# Patient Record
Sex: Male | Born: 1985 | Race: Black or African American | Hispanic: No | Marital: Single
Health system: Southern US, Community
[De-identification: ages and names within clinical notes are randomized; demographics above are authoritative.]

---

## 2009-02-28 ENCOUNTER — Inpatient Hospital Stay: Payer: Self-pay | Admitting: Internal Medicine

## 2009-10-11 ENCOUNTER — Emergency Department: Payer: Self-pay | Admitting: Emergency Medicine

## 2009-10-27 ENCOUNTER — Emergency Department: Payer: Self-pay | Admitting: Emergency Medicine

## 2009-10-29 ENCOUNTER — Emergency Department: Payer: Self-pay | Admitting: Emergency Medicine

## 2012-11-02 ENCOUNTER — Emergency Department: Payer: Self-pay | Admitting: Emergency Medicine

## 2013-02-15 ENCOUNTER — Emergency Department: Payer: Self-pay | Admitting: Emergency Medicine

## 2013-02-15 LAB — CBC
HCT: 43.6 % (ref 40.0–52.0)
HGB: 13.8 g/dL (ref 13.0–18.0)
MCH: 25.2 pg — ABNORMAL LOW (ref 26.0–34.0)
MCHC: 31.5 g/dL — ABNORMAL LOW (ref 32.0–36.0)
MCV: 80 fL (ref 80–100)
PLATELETS: 188 10*3/uL (ref 150–440)
RBC: 5.47 10*6/uL (ref 4.40–5.90)
RDW: 15.8 % — AB (ref 11.5–14.5)
WBC: 7.1 10*3/uL (ref 3.8–10.6)

## 2013-02-15 LAB — COMPREHENSIVE METABOLIC PANEL
ALT: 68 U/L (ref 12–78)
Albumin: 3.3 g/dL — ABNORMAL LOW (ref 3.4–5.0)
Alkaline Phosphatase: 162 U/L — ABNORMAL HIGH
Anion Gap: 2 — ABNORMAL LOW (ref 7–16)
BUN: 15 mg/dL (ref 7–18)
Bilirubin,Total: 0.4 mg/dL (ref 0.2–1.0)
CHLORIDE: 107 mmol/L (ref 98–107)
Calcium, Total: 8.8 mg/dL (ref 8.5–10.1)
Co2: 27 mmol/L (ref 21–32)
Creatinine: 1.06 mg/dL (ref 0.60–1.30)
EGFR (Non-African Amer.): >60
GLUCOSE: 86 mg/dL (ref 65–99)
OSMOLALITY: 272 (ref 275–301)
POTASSIUM: 4.2 mmol/L (ref 3.5–5.1)
SGOT(AST): 62 U/L — ABNORMAL HIGH (ref 15–37)
Sodium: 136 mmol/L (ref 136–145)
Total Protein: 7.5 g/dL (ref 6.4–8.2)

## 2013-02-15 LAB — ACETAMINOPHEN LEVEL

## 2013-02-15 LAB — TSH: THYROID STIMULATING HORM: 0.9 u[IU]/mL

## 2013-02-15 LAB — SALICYLATE LEVEL: Salicylates, Serum: 1.7 mg/dL

## 2013-02-15 LAB — ETHANOL
Ethanol %: <0.003 % (ref 0.000–0.080)
Ethanol: 3 mg/dL

## 2013-02-16 LAB — DRUG SCREEN, URINE
AMPHETAMINES, UR SCREEN: NEGATIVE (ref ?–1000)
BENZODIAZEPINE, UR SCRN: POSITIVE (ref ?–200)
Barbiturates, Ur Screen: NEGATIVE (ref ?–200)
CANNABINOID 50 NG, UR ~~LOC~~: POSITIVE (ref ?–50)
COCAINE METABOLITE, UR ~~LOC~~: POSITIVE (ref ?–300)
MDMA (Ecstasy)Ur Screen: NEGATIVE (ref ?–500)
METHADONE, UR SCREEN: NEGATIVE (ref ?–300)
Opiate, Ur Screen: NEGATIVE (ref ?–300)
Phencyclidine (PCP) Ur S: NEGATIVE (ref ?–25)
TRICYCLIC, UR SCREEN: NEGATIVE (ref ?–1000)

## 2013-05-30 ENCOUNTER — Emergency Department: Payer: Self-pay | Admitting: Emergency Medicine

## 2013-06-02 LAB — BETA STREP CULTURE(ARMC)

## 2013-06-23 ENCOUNTER — Emergency Department: Payer: Self-pay | Admitting: Emergency Medicine

## 2014-04-29 ENCOUNTER — Emergency Department
Admit: 2014-04-29 | Disposition: A | Discharge status: Home / Self Care | Payer: Self-pay | Admitting: Emergency Medicine

## 2014-04-29 LAB — BASIC METABOLIC PANEL
Anion Gap: 7 (ref 7–16)
BUN: 19 mg/dL
CHLORIDE: 102 mmol/L
CREATININE: 1.17 mg/dL
Calcium, Total: 9.4 mg/dL
Co2: 31 mmol/L
EGFR (African American): >60
EGFR (Non-African Amer.): >60
Glucose: 99 mg/dL
Potassium: 3.6 mmol/L
Sodium: 140 mmol/L

## 2014-04-29 LAB — CBC
HCT: 46 % (ref 40.0–52.0)
HGB: 15 g/dL (ref 13.0–18.0)
MCH: 25.6 pg — ABNORMAL LOW (ref 26.0–34.0)
MCHC: 32.6 g/dL (ref 32.0–36.0)
MCV: 78 fL — ABNORMAL LOW (ref 80–100)
Platelet: 199 10*3/uL (ref 150–440)
RBC: 5.87 10*6/uL (ref 4.40–5.90)
RDW: 16.2 % — ABNORMAL HIGH (ref 11.5–14.5)
WBC: 9.7 10*3/uL (ref 3.8–10.6)

## 2014-04-29 LAB — TROPONIN I: Troponin-I: <0.03 ng/mL

## 2014-09-02 ENCOUNTER — Other Ambulatory Visit: Payer: Self-pay | Admitting: Family Medicine

## 2014-09-02 ENCOUNTER — Ambulatory Visit
Admission: RE | Admit: 2014-09-02 | Discharge: 2014-09-02 | Disposition: A | Discharge status: Home / Self Care | Payer: Self-pay | Source: Ambulatory Visit | Attending: Family Medicine | Admitting: Family Medicine

## 2014-09-02 DIAGNOSIS — M25561 Pain in right knee: Secondary | ICD-10-CM

## 2014-09-02 DIAGNOSIS — M25461 Effusion, right knee: Secondary | ICD-10-CM | POA: Insufficient documentation

## 2014-11-11 ENCOUNTER — Emergency Department
Admission: EM | Admit: 2014-11-11 | Discharge: 2014-11-15 | Disposition: E | Payer: Self-pay | Attending: Emergency Medicine | Admitting: Emergency Medicine

## 2014-11-11 DIAGNOSIS — Y9289 Other specified places as the place of occurrence of the external cause: Secondary | ICD-10-CM | POA: Insufficient documentation

## 2014-11-11 DIAGNOSIS — Y9389 Activity, other specified: Secondary | ICD-10-CM | POA: Insufficient documentation

## 2014-11-11 DIAGNOSIS — E669 Obesity, unspecified: Secondary | ICD-10-CM | POA: Insufficient documentation

## 2014-11-11 DIAGNOSIS — T405X1A Poisoning by cocaine, accidental (unintentional), initial encounter: Secondary | ICD-10-CM | POA: Insufficient documentation

## 2014-11-11 DIAGNOSIS — I469 Cardiac arrest, cause unspecified: Secondary | ICD-10-CM | POA: Insufficient documentation

## 2014-11-11 DIAGNOSIS — Y998 Other external cause status: Secondary | ICD-10-CM | POA: Insufficient documentation

## 2014-11-11 MED ORDER — DEXTROSE 50 % IV SOLN
INTRAVENOUS | Status: AC | PRN
  Administered 2014-11-11: 1 via INTRAVENOUS

## 2014-11-11 MED ORDER — SODIUM CHLORIDE 0.9 % IV SOLN
INTRAVENOUS | Status: AC | PRN
  Administered 2014-11-11: 1000 mL via INTRAVENOUS

## 2014-11-11 MED ORDER — EPINEPHRINE HCL 0.1 MG/ML IJ SOSY
PREFILLED_SYRINGE | INTRAMUSCULAR | Status: AC | PRN
  Administered 2014-11-11 (×4): 1 mg via INTRAVENOUS

## 2014-11-11 MED ORDER — SODIUM BICARBONATE 8.4 % IV SOLN
INTRAVENOUS | Status: AC | PRN
  Administered 2014-11-11 (×2): 50 meq via INTRAVENOUS

## 2014-11-11 MED ORDER — NALOXONE HCL 2 MG/2ML IJ SOSY
PREFILLED_SYRINGE | INTRAMUSCULAR | Status: AC | PRN
  Administered 2014-11-11: 4 mg via INTRAVENOUS

## 2014-11-11 MED FILL — Medication: Qty: 1 | Status: AC

## 2014-11-15 NOTE — ED Provider Notes (Signed)
[facility] Emergency Department Provider Note   ____________________________________________  Time seen: Upon EMS arrival I have reviewed the triage vital signs and the triage nursing note.  HISTORY  Chief Complaint No chief complaint on file.  cardiopulmonary arrest  Historian Limited, patient under cardiopulmonary arrest. History obtained from EMS History obtained from family members  HPI Juan Vazquez is a 29 y.o. male who reportedly "swallowed an eightball"at some point overnight. Reportedly according to his family he was stopped by the police and ingested a bag of cocaine. At home he started to act erratic, and profusely sweating and beat the walls according to family member. Upon EMS arrival the patient was witnessed to have what appeared to be a seizure/convulsions. During EMS transport the patient did convulse again and went into full cardiopulmonary arrest. A King airway was placed prehospital, and CPR was initiated. There was no reported return of spontaneous circulation, and no shockable rhythm. 3 rounds of epinephrine were administered prehospital.    No past medical history on file. obesity  There are no active problems to display for this patient.   No past surgical history on file.  No current outpatient prescriptions on file.  Allergies Review of patient's allergies indicates not on file.  No family history on file.  Social History Social History  Substance Use Topics  . Smoking status: Not on file  . Smokeless tobacco: Not on file  . Alcohol Use: Not on file   unknown  Review of Systems Limited/unknown due to patient in cardiopulmonary arrest Constitutional: . Eyes:  ENT:  Cardiovascular:  Respiratory:  Gastrointestinal:  Genitourinary:  Musculoskeletal: Skin:  Neurological:   ____________________________________________   PHYSICAL EXAM:  VITAL SIGNS: Pulseless, active CPR in progress. Bag valve ventilated  through a King airway ED Triage Vitals  Enc Vitals Group     BP --      Pulse --      Resp --      Temp --      Temp src --      SpO2 --      Weight --      Height --      Head Cir --      Peak Flow --      Pain Score --      Pain Loc --      Pain Edu? --      Excl. in [place]? --      Constitutional: Full cardiopulmonary arrest. Eyes: Conjunctivae are normal. Pupils fixed mid-dilated. ENT   Head: Normocephalic and atraumatic.   Nose:   Mouth/Throat: Mucous membranes are moist. King airway in place. Large tongue slightly purplish   Neck: No visible abnormal Cardiovascular/Chest: Good femoral pulses with CPR Respiratory: Equal breath sounds with bag Gastrointestinal: Soft. Obese Genitourinary/rectal: Musculoskeletal: No visible trauma Neurologic: No movement, no attempts at breathing Skin:  Skin is cool.  ____________________________________________   EKG I, Lisa Roca, MD, the attending physician have personally viewed and interpreted all ECGs.  No EKG performed ____________________________________________  LABS (pertinent positives/negatives)  None  ____________________________________________  RADIOLOGY All Xrays were viewed by me. Imaging interpreted by Radiologist.  None __________________________________________  PROCEDURES  Procedure(s) performed: None  Critical Care performed: CRITICAL CARE Performed by: Lisa Roca   Total critical care time: 60 minutes  Critical care time was exclusive of separately billable procedures and treating other patients.  Critical care was necessary to treat or prevent imminent or life-threatening deterioration.  Critical care was time  spent personally by me on the following activities: development of treatment plan with patient and/or surrogate as well as nursing, discussions with consultants, evaluation of patient's response to treatment, examination of patient, obtaining history from patient or  surrogate, ordering and performing treatments and interventions, ordering and review of laboratory studies, ordering and review of radiographic studies, pulse oximetry and re-evaluation of patient's condition.   ____________________________________________   ED COURSE / ASSESSMENT AND PLAN  CONSULTATIONS: Medical Examiner Lanny Hurst), referred and accepted for autopsy  Pertinent labs & imaging results that were available during my care of the patient were reviewed by me and considered in my medical decision making (see chart for details).  Patient arrived as a young male concerning for possible drug overdose, who had a cardiopulmonary arrest after being witnessed to be short of breath, diaphoretic, and then have convulsions. The patient was given Narcan and epinephrine prehospital with no improvement.  On arrival was confirmed to the patient had a disorganized rhythm with no pulse. CPR was continued according to typical protocol. Patient's King airway was assessed and felt to be adequate for oxygenation. Although the patient's initial sats were in the 39s, they did come up to 100% during the process of CPR with good bagging.  I felt no indication for removing the adequate airway.  Given there is no clear known toxic ingestion, 2 A of bicarbonate were tried as well as an amp of glucose in addition to continuing epinephrine every 3 minutes.  At every rhythm check there was pulseless electrical activity. After multiple rounds of high quality chest compressions, medications, airway management, and 2 L fluid bolus, bedside ultrasound was used to evaluate cardiac activity and there was found to be no cardiac wall motion.  At this point CPR was discontinued and time death was pronounced at 12:24 PM.  With nursing chaperone at the bedside, I did inform the patient's family.  The patient's mom did show up later and was informed/met by nurse.   The family did provide additional history is that the  patient took in overdose on cocaine. This was not reported suicide, however he was apparently trying to hide evidence from police when he ingested a large bag of cocaine.  This is consistent with likely coronary event causing death.  Case was referred to the medical examiner and accepted for autopsy.    ___________________________________________   FINAL CLINICAL IMPRESSION(S) / ED DIAGNOSES   Final diagnoses:  Cardiopulmonary arrest ([place])  Cocaine overdose, accidental or unintentional, initial encounter       Lisa Roca, MD 2014-11-21 [id number]

## 2014-11-15 NOTE — Progress Notes (Signed)
   2014/01/28 1230  Clinical Encounter Type  Visited With Family  Visit Type Death  Referral From Nurse  Consult/Referral To Chaplain  Spiritual Encounters  Spiritual Needs Grief support  Paged to ED family room. Family was being notified of death of patient due to overdose. Provided grief and  emotional support. Chaplain Cordelia PocheEmily Stpehen Petitjean

## 2014-11-15 NOTE — ED Notes (Signed)
Pt brought in via EMS. CPR in progress. EMS reports pt ingested an eight ball of cocaine last night. EMS reports pt was having seizure activity upon arrival. EMS reports pt became post-ictal and became combative. EMS reports pt had another seizure when placed on stretcher. EMS reports pt went into cardiac arrest. EMS began CPR. King airway placed by EMS. EMS medications reported Narcan 4 mg, total of Epinephrine 3 mg.

## 2014-11-15 DEATH — deceased

## 2015-08-03 IMAGING — CT CT HEAD WITHOUT CONTRAST
1 series · 16 of 30 positions shown, 20 images · non-contrast
Comparison: 06/23/2013

CLINICAL DATA: Dizziness, worsening today. Falling. Headache.
Hypertension.

EXAM:
CT HEAD WITHOUT CONTRAST
TECHNIQUE: Contiguous axial images were obtained from the base of the skull
through the vertex without intravenous contrast.

[Series 2: soft tissue · axial · 0.41mm/px · z∈[-236,-102]mm · 16 of 31 slices shown, 20 images]
[im 2/31  brain]
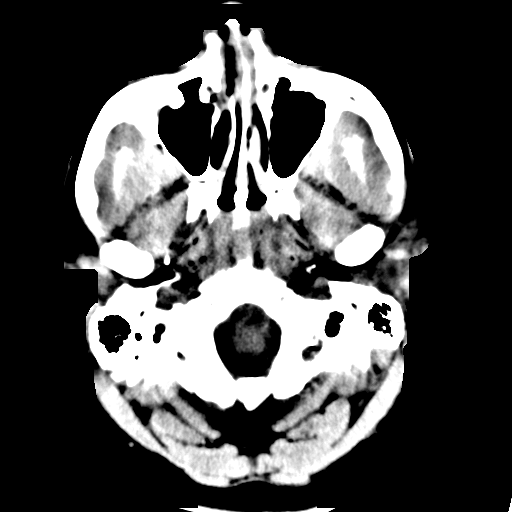
[im 2/31  bone]
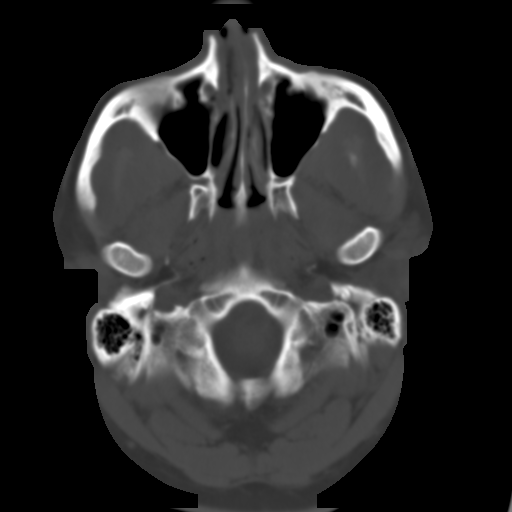
[im 4/31  brain]
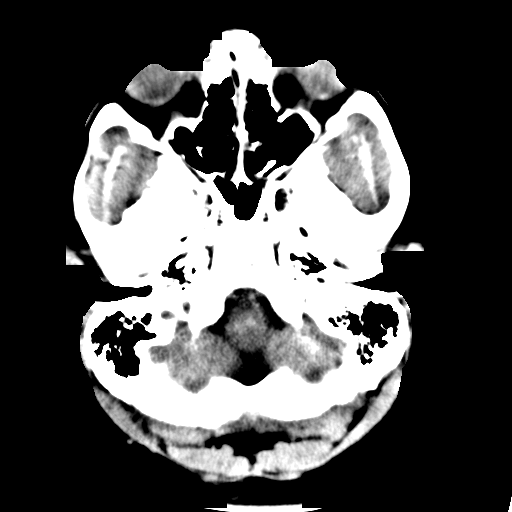
[im 6/31  brain]
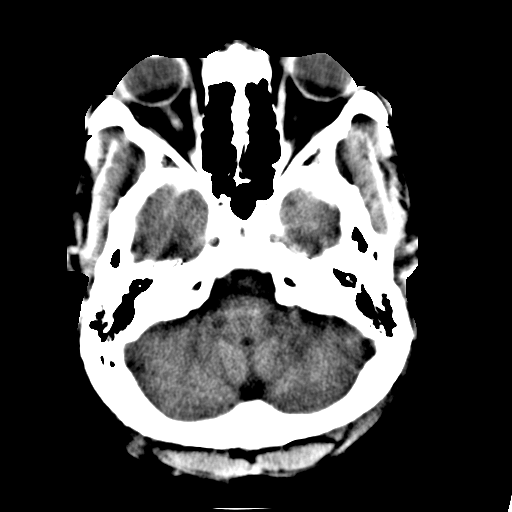
[im 8/31  brain]
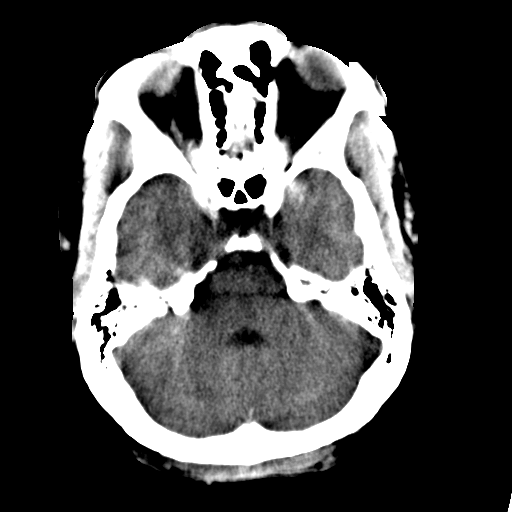
[im 9/31  brain]
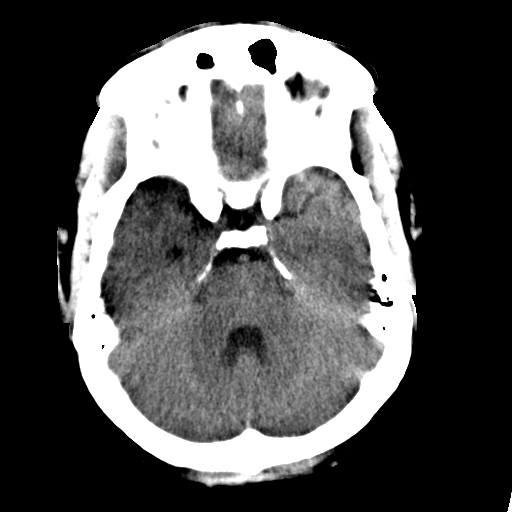
[im 9/31  bone]
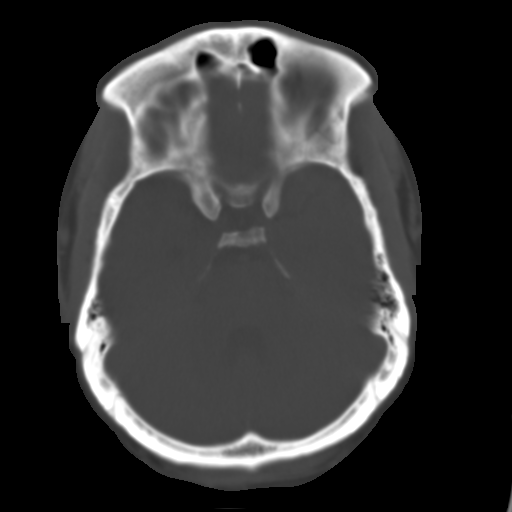
[im 11/31  brain]
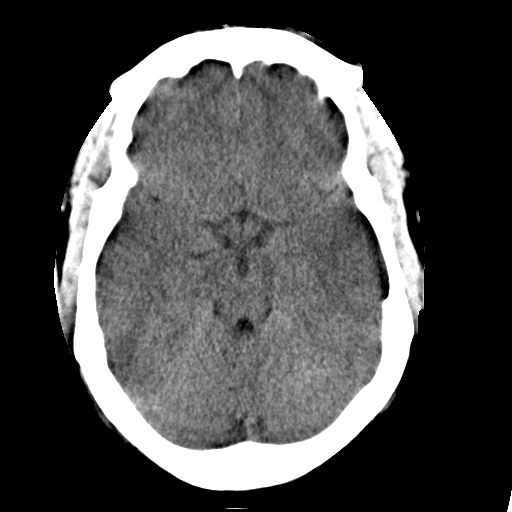
[im 13/31  brain]
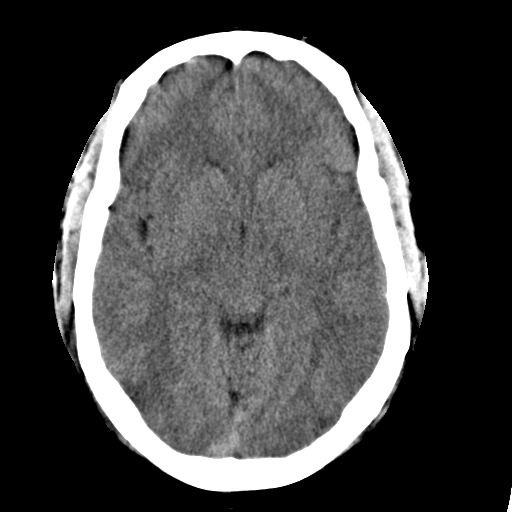
[im 15/31  brain]
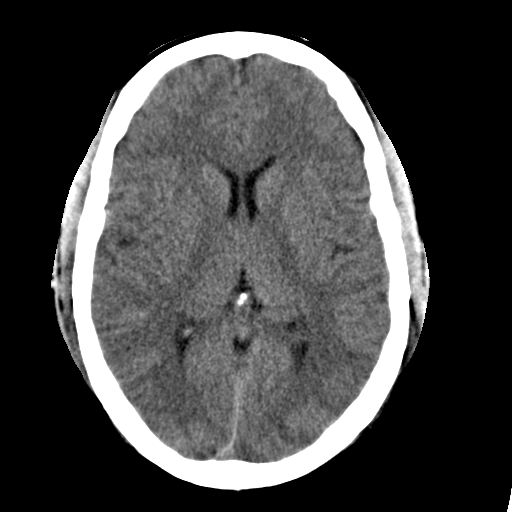
[im 16/31  brain]
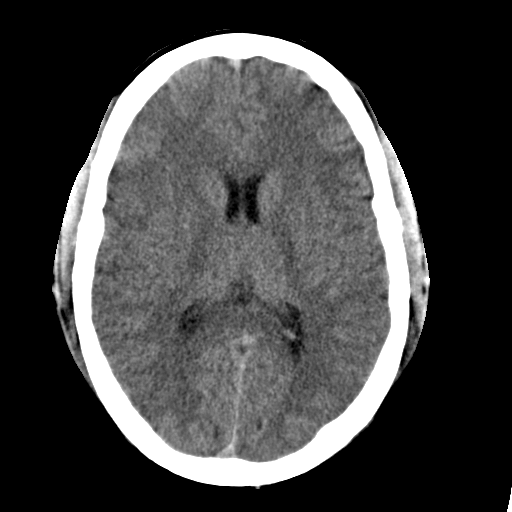
[im 16/31  bone]
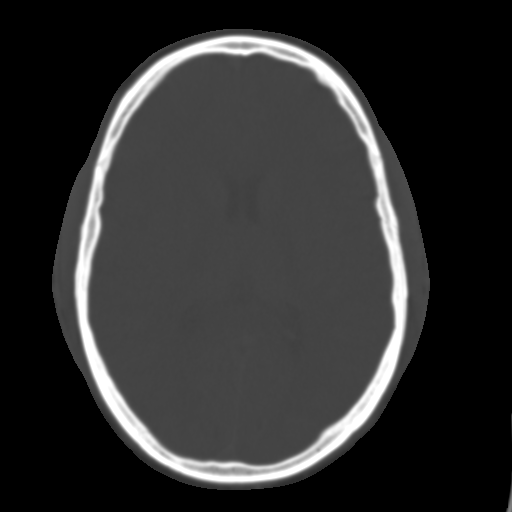
[im 18/31  brain]
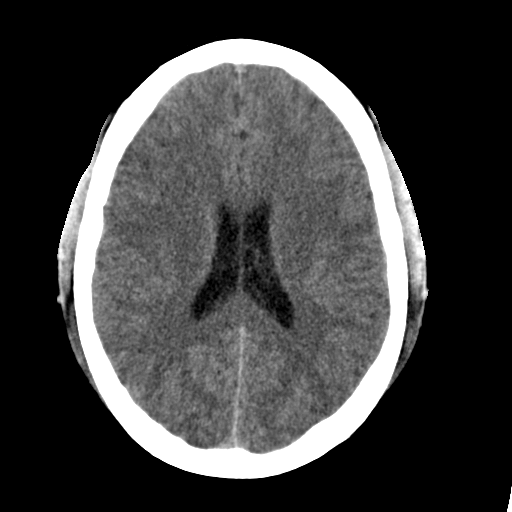
[im 20/31  brain]
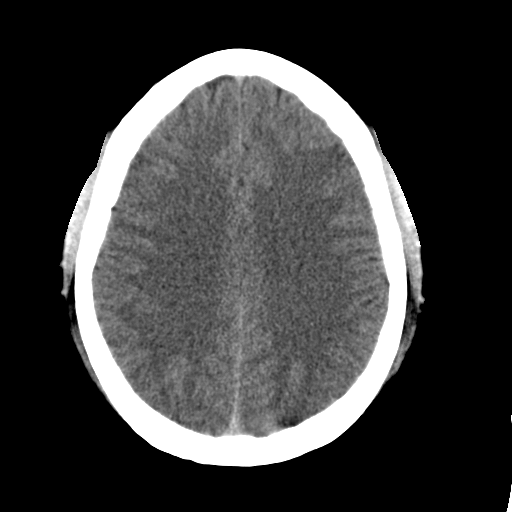
[im 22/31  brain]
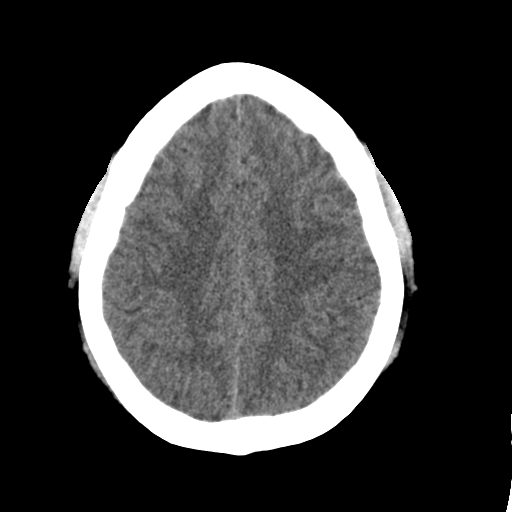
[im 23/31  brain]
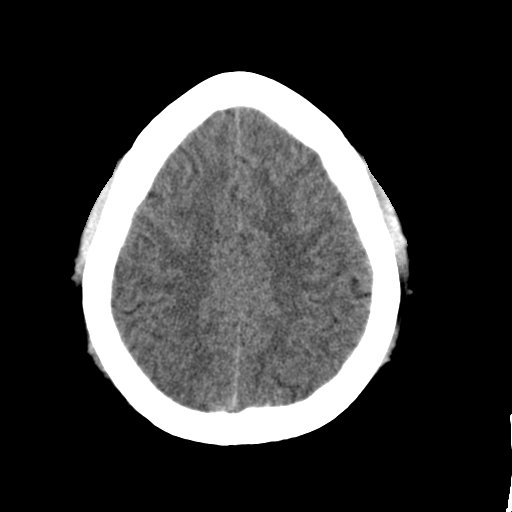
[im 23/31  bone]
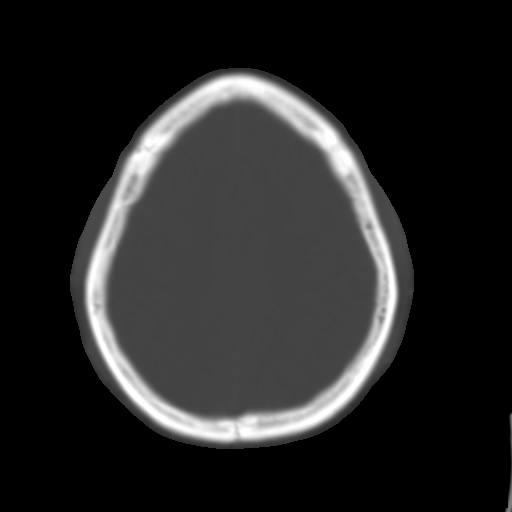
[im 25/31  brain]
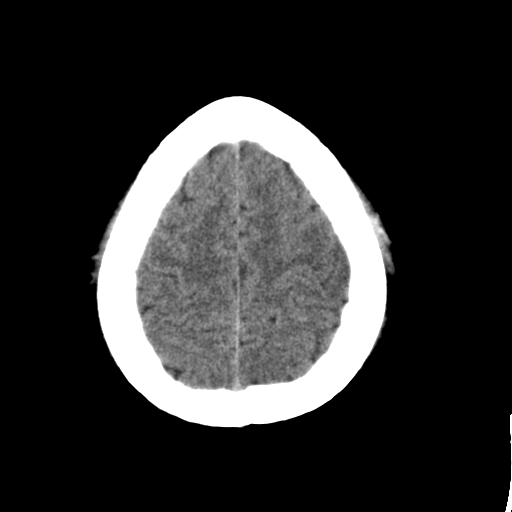
[im 27/31  brain]
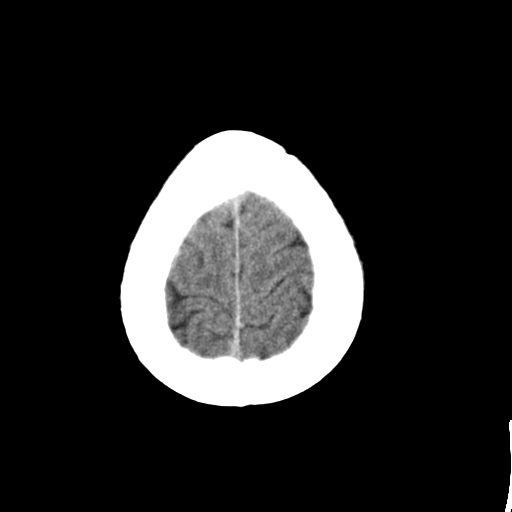
[im 29/31  brain]
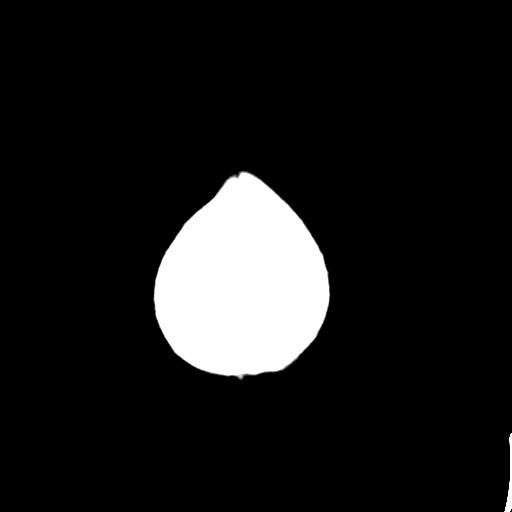

[16 of 30 positions shown; findings below may reference images not displayed]

FINDINGS: The brain has a normal appearance without evidence of atrophy,
infarction, mass lesion, hemorrhage, hydrocephalus or extra-axial
collection. The calvarium is unremarkable. The paranasal sinuses,
middle ears and mastoids are clear.
IMPRESSION: Normal head CT

## 2015-12-07 IMAGING — CR DG KNEE COMPLETE 4+V*R*
1 series · 4 of 4 positions shown · non-contrast
Comparison: None.

CLINICAL DATA: Knee pain for 1 year, remote history of trauma,
initial encounter

EXAM:
RIGHT KNEE - COMPLETE 4+ VIEW

[Series 1: dg knee complete 4 views right · 0.14mm/px · 4 of 4 slices shown]
[im 1/4]
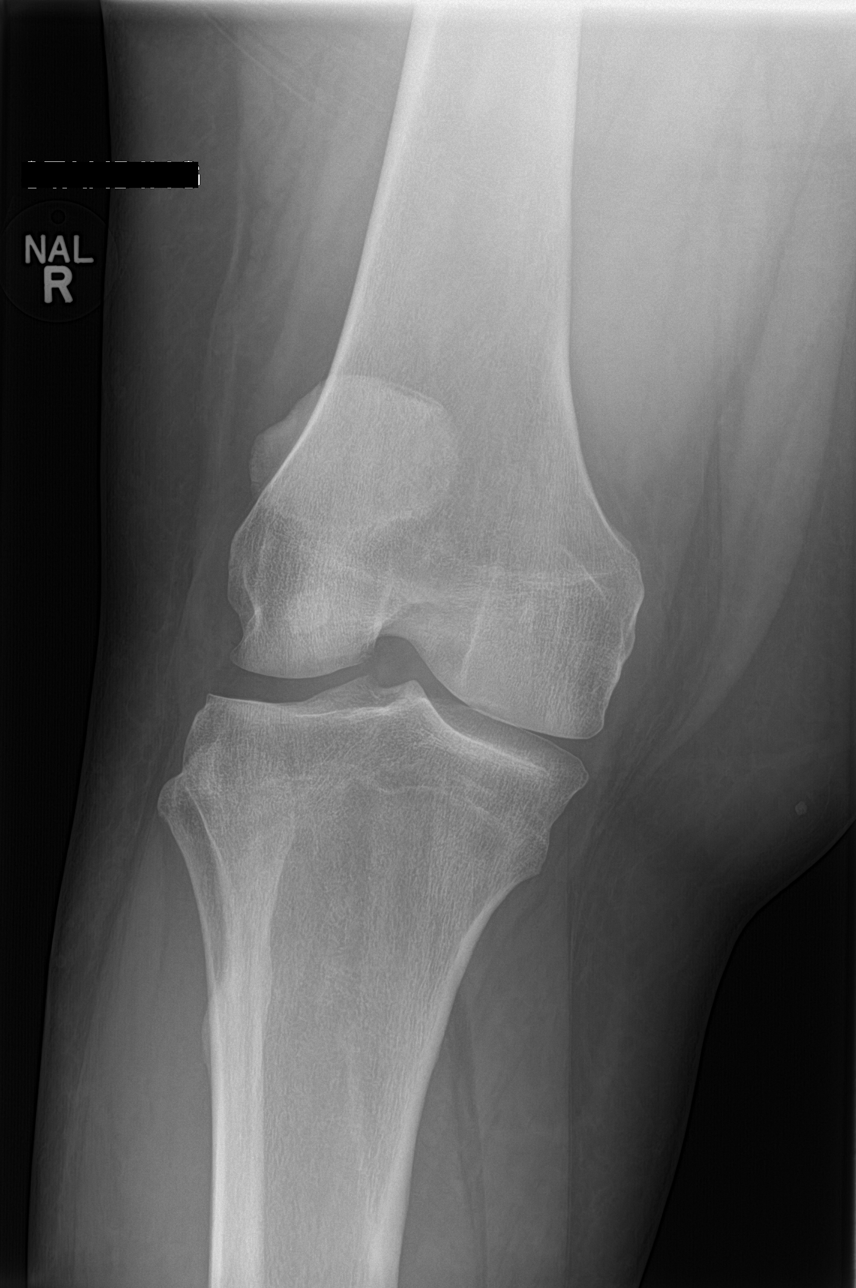
[im 2/4]
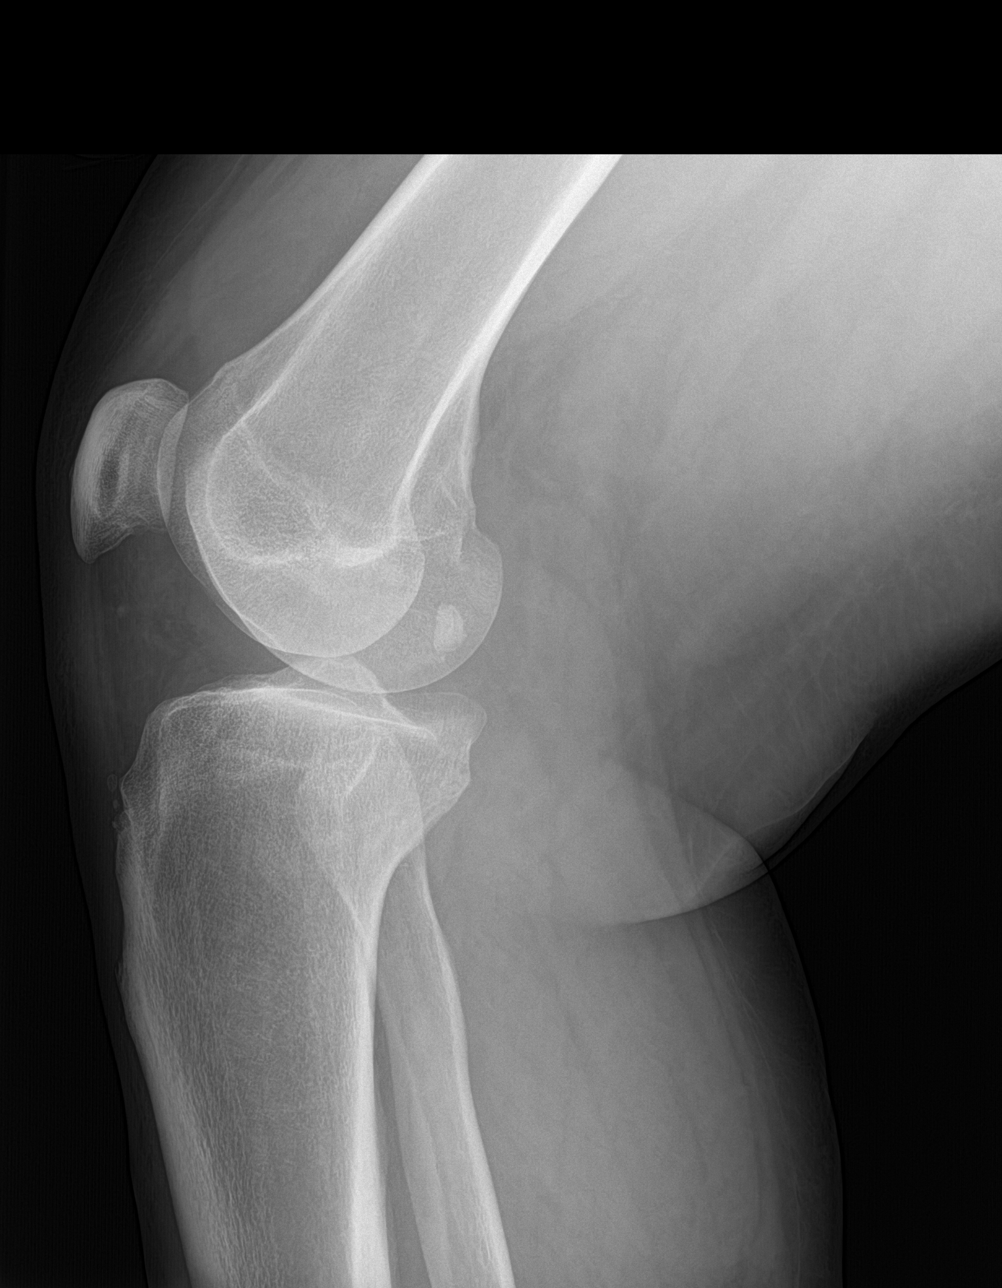
[im 3/4]
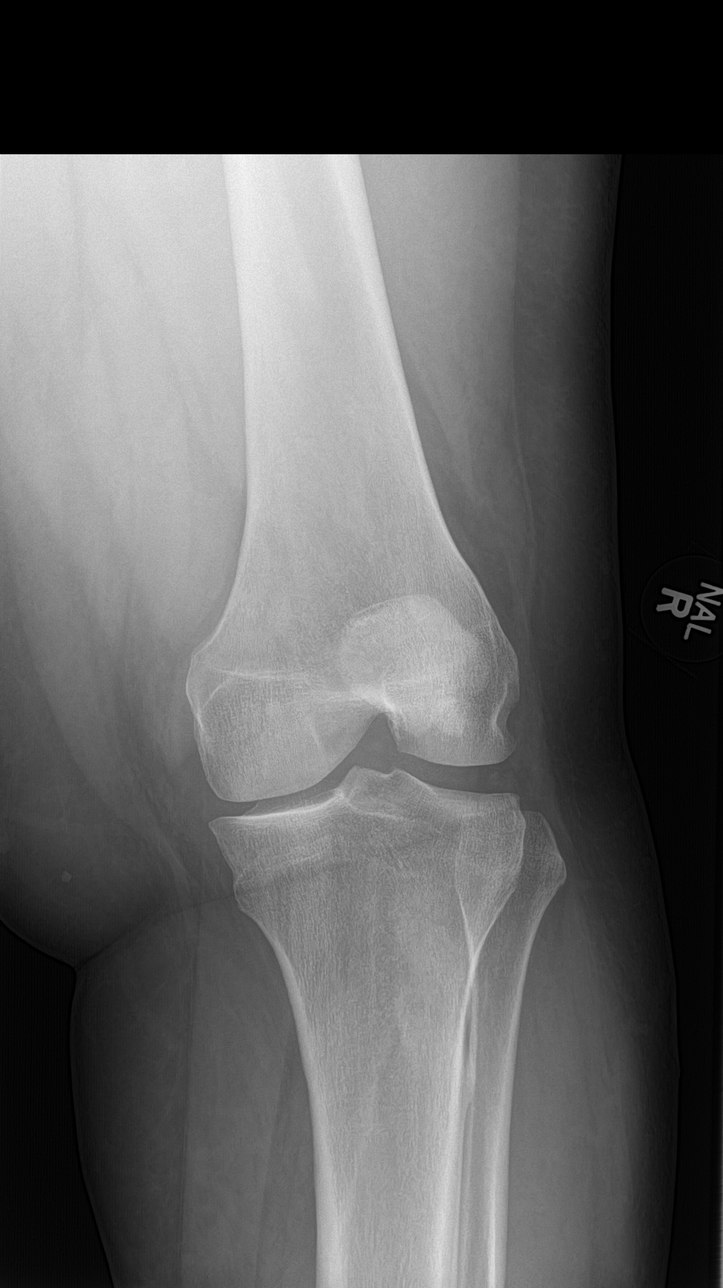
[im 4/4]
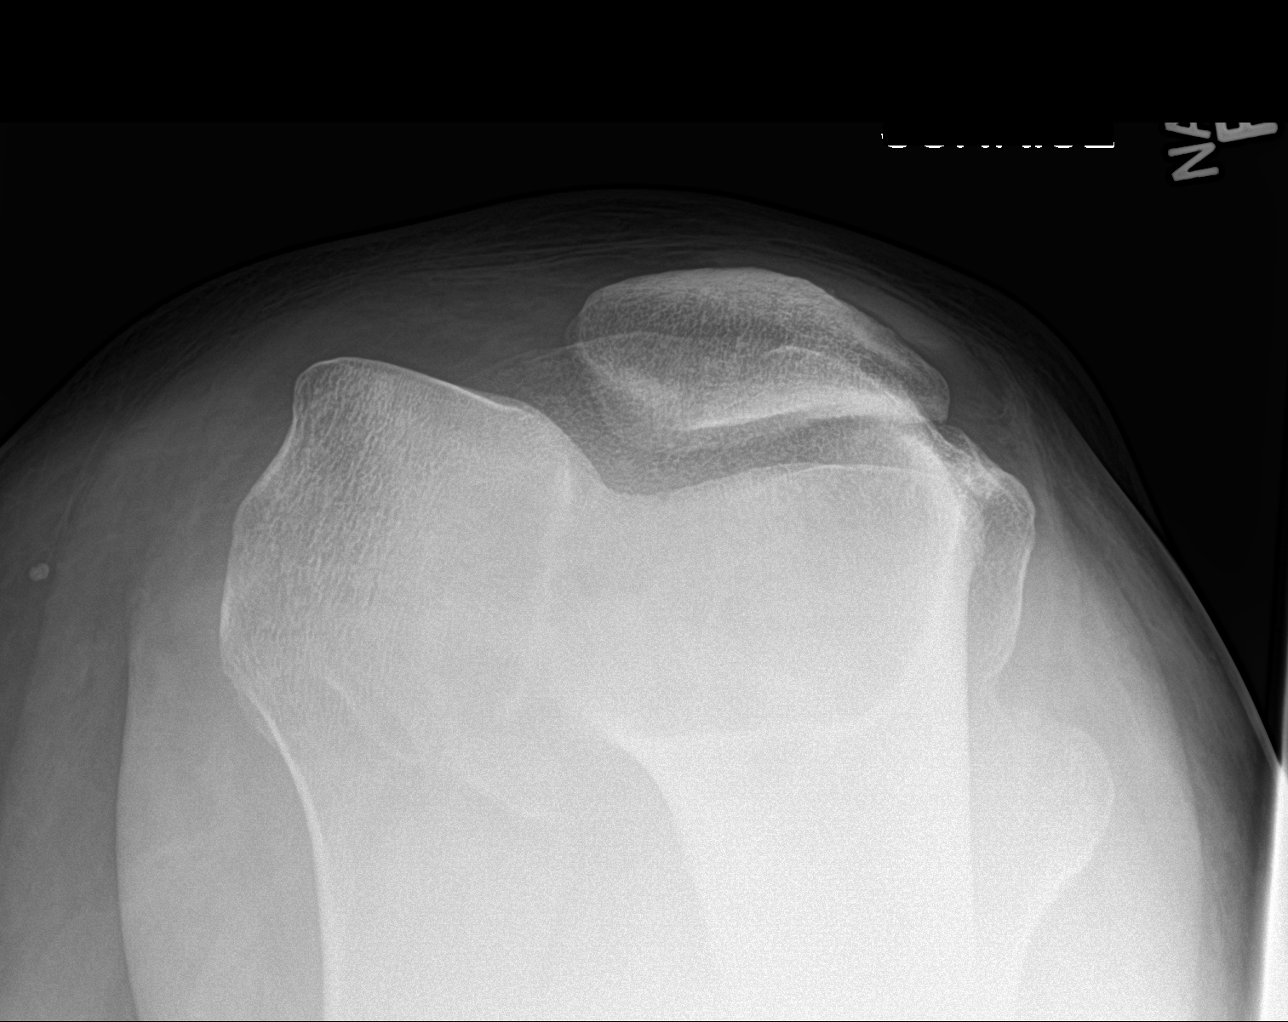

[4 of 4 positions shown; findings below may reference images not displayed]

FINDINGS: Moderate joint effusion is noted. No acute fracture or dislocation
is seen. No other soft tissue abnormality is noted.
IMPRESSION: Moderate joint effusion without acute bony abnormality.
